# Patient Record
Sex: Female | Born: 1980 | Hispanic: Yes | Marital: Single | Smoking: Former smoker
Health system: Southern US, Community
[De-identification: ages and names within clinical notes are randomized; demographics above are authoritative.]

## PROBLEM LIST (undated history)

## (undated) DIAGNOSIS — J302 Other seasonal allergic rhinitis: Secondary | ICD-10-CM

## (undated) DIAGNOSIS — R87810 Cervical high risk human papillomavirus (HPV) DNA test positive: Secondary | ICD-10-CM

## (undated) DIAGNOSIS — G43009 Migraine without aura, not intractable, without status migrainosus: Secondary | ICD-10-CM

## (undated) DIAGNOSIS — B373 Candidiasis of vulva and vagina: Secondary | ICD-10-CM

## (undated) DIAGNOSIS — T7840XA Allergy, unspecified, initial encounter: Secondary | ICD-10-CM

## (undated) DIAGNOSIS — Z9289 Personal history of other medical treatment: Secondary | ICD-10-CM

## (undated) DIAGNOSIS — R87619 Unspecified abnormal cytological findings in specimens from cervix uteri: Secondary | ICD-10-CM

## (undated) DIAGNOSIS — K219 Gastro-esophageal reflux disease without esophagitis: Secondary | ICD-10-CM

## (undated) DIAGNOSIS — B3731 Acute candidiasis of vulva and vagina: Secondary | ICD-10-CM

## (undated) DIAGNOSIS — A64 Unspecified sexually transmitted disease: Secondary | ICD-10-CM

## (undated) HISTORY — DX: Unspecified sexually transmitted disease: A64

## (undated) HISTORY — DX: Allergy, unspecified, initial encounter: T78.40XA

## (undated) HISTORY — DX: Acute candidiasis of vulva and vagina: B37.31

## (undated) HISTORY — DX: Cervical high risk human papillomavirus (HPV) DNA test positive: R87.810

## (undated) HISTORY — DX: Migraine without aura, not intractable, without status migrainosus: G43.009

## (undated) HISTORY — DX: Candidiasis of vulva and vagina: B37.3

## (undated) HISTORY — DX: Other seasonal allergic rhinitis: J30.2

## (undated) HISTORY — DX: Personal history of other medical treatment: Z92.89

## (undated) HISTORY — PX: COLPOSCOPY: SHX161

## (undated) HISTORY — DX: Gastro-esophageal reflux disease without esophagitis: K21.9

## (undated) HISTORY — DX: Unspecified abnormal cytological findings in specimens from cervix uteri: R87.619

---

## 2006-05-02 DIAGNOSIS — A64 Unspecified sexually transmitted disease: Secondary | ICD-10-CM

## 2006-05-02 HISTORY — DX: Unspecified sexually transmitted disease: A64

## 2011-10-03 ENCOUNTER — Ambulatory Visit: Payer: Self-pay | Admitting: Internal Medicine

## 2013-05-16 DIAGNOSIS — Z9289 Personal history of other medical treatment: Secondary | ICD-10-CM

## 2013-05-16 HISTORY — DX: Personal history of other medical treatment: Z92.89

## 2014-05-02 DIAGNOSIS — R87619 Unspecified abnormal cytological findings in specimens from cervix uteri: Secondary | ICD-10-CM

## 2014-05-02 HISTORY — DX: Unspecified abnormal cytological findings in specimens from cervix uteri: R87.619

## 2015-02-11 ENCOUNTER — Ambulatory Visit (INDEPENDENT_AMBULATORY_CARE_PROVIDER_SITE_OTHER): Payer: BLUE CROSS/BLUE SHIELD | Admitting: Podiatry

## 2015-02-11 ENCOUNTER — Encounter: Payer: Self-pay | Admitting: Podiatry

## 2015-02-11 ENCOUNTER — Ambulatory Visit (INDEPENDENT_AMBULATORY_CARE_PROVIDER_SITE_OTHER): Payer: BLUE CROSS/BLUE SHIELD

## 2015-02-11 VITALS — BP 99/71 | HR 94 | Resp 18

## 2015-02-11 DIAGNOSIS — L600 Ingrowing nail: Secondary | ICD-10-CM

## 2015-02-11 DIAGNOSIS — M2010 Hallux valgus (acquired), unspecified foot: Secondary | ICD-10-CM

## 2015-02-11 DIAGNOSIS — L603 Nail dystrophy: Secondary | ICD-10-CM | POA: Diagnosis not present

## 2015-02-11 DIAGNOSIS — R52 Pain, unspecified: Secondary | ICD-10-CM

## 2015-02-11 NOTE — Progress Notes (Signed)
   Subjective:    Patient ID: Leslie Hendrix, female    DOB: 04/21/1981, 34 y.o.   MRN: [id number]  HPI i have some bunions and multiple ankle sprains on both feet and there is no burning or throbbing and I do get ingrowns often and no laxity without having them removed. My ankles don't bother him enough to consider surgery at this point. However my bunions are becoming painful and are starting to limit my daily activities. She states that she does not even like to work because bunions bother her.    Review of Systems  HENT: Positive for ear pain and sinus pressure.   Respiratory: Positive for cough and wheezing.   Allergic/Immunologic: Positive for environmental allergies.  Hematological: Bruises/bleeds easily.  All other systems reviewed and are negative.      Objective:   Physical Exam: 34 year old female in no apparent distress vital signs stable alert and oriented 3. Pulses are palpable bilateral. Neurologic sensorium is intact per Semmes-Weinstein monofilament. Deep tendon reflexes are intact bilaterally muscle strength +5 over 5 dorsiflexors plantar flexors and inverters everters all of his musculature is intact. Orthopedic evaluation demonstrates all joints distal to the ankle and full range of motion without crepitation. Cutaneous evaluation demonstrates supple. Cutis no erythema edema cellulitis drainage or odor. No pain on range of motion of the ankles or on palpation of the anterior talofibular ligaments. The pain around the posterior tibial tendon or the Achilles tendons. She has very flexible subtalar joints. She has hallux abductovalgus of vomiting bilateral right greater than left with a very prominent hypertrophic medial condyle of the head of the first metatarsal right greater than left. Cutaneous evaluation demonstrates supple well-hydrated cutis no erythema edema cellulitis drainage or odor other than mild erythema along an incurvated nail margin tibial borders hallux bilateral.  Radiographic evaluation does demonstrates an increase in the first intermetatarsal angle greater than 15 bilaterally hallux abductus angle greater than normal value. Hypertrophic medial condyle is present. No signs of osteoarthritic change. Soft tissue margins appear to be normal.        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 Ingrown nails tibial borders hallux right greater than left.  Plan: We discussed the etiology pathology conservative versus surgical therapies. At this point we took samples of the nail sent for pathologic evaluation. We consented her today [facility] with screw fixation right foot. I answered all of questions regarding this procedure to the best of my ability in layman's terms we also consented her for a matrixectomy tibial border hallux right. I answered all questions regarding these procedures the best mobility in layman's terms as well. We did discuss the possible postop complications which may include but are not limited to postop pain bleeding swelling infection recurrence need for further surgery overcorrection under correction loss of digit loss of limb loss of life. We dispensed a cam walker today for follow-up and for postop recovery. I will follow-up with her in the near future for surgical intervention. 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  Arbutus Pedodd Ester Hilley DPM

## 2015-02-11 NOTE — Patient Instructions (Signed)
Pre-Operative Instructions  Congratulations, you have decided to take an important step to improving your quality of life.  You can be assured that the doctors of [facility] will be with you every step of the way.  1. Plan to be at the surgery center/hospital at least 1 (one) hour prior to your scheduled time unless otherwise directed by the surgical center/hospital staff.  You must have a responsible adult accompany you, remain during the surgery and drive you home.  Make sure you have directions to the surgical center/hospital and know how to get there on time. 2. For hospital based surgery you will need to obtain a history and physical form from your family physician within 1 month prior to the date of surgery- we will give you a form for you primary physician.  3. We make every effort to accommodate the date you request for surgery.  There are however, times where surgery dates or times have to be moved.  We will contact you as soon as possible if a change in schedule is required.   4. No Aspirin/Ibuprofen for one week before surgery.  If you are on aspirin, any non-steroidal anti-inflammatory medications (Mobic, Aleve, Ibuprofen) you should stop taking it 7 days prior to your surgery.  You make take Tylenol  For pain prior to surgery.  5. Medications- If you are taking daily heart and blood pressure medications, seizure, reflux, allergy, asthma, anxiety, pain or diabetes medications, make sure the surgery center/hospital is aware before the day of surgery so they may notify you which medications to take or avoid the day of surgery. 6. No food or drink after midnight the night before surgery unless directed otherwise by surgical center/hospital staff. 7. No alcoholic beverages 24 hours prior to surgery.  No smoking 24 hours prior to or 24 hours after surgery. 8. Wear loose pants or shorts- loose enough to fit over bandages, boots, and casts. 9. No slip on shoes, sneakers are best. 10. Bring  your boot with you to the surgery center/hospital.  Also bring crutches or a walker if your physician has prescribed it for you.  If you do not have this equipment, it will be provided for you after surgery. 11. If you have not been contracted by the surgery center/hospital by the day before your surgery, call to confirm the date and time of your surgery. 12. Leave-time from work may vary depending on the type of surgery you have.  Appropriate arrangements should be made prior to surgery with your employer. 13. Prescriptions will be provided immediately following surgery by your doctor.  Have these filled as soon as possible after surgery and take the medication as directed. 14. Remove nail polish on the operative foot. 15. Wash the night before surgery.  The night before surgery wash the foot and leg well with the antibacterial soap provided and water paying special attention to beneath the toenails and in between the toes.  Rinse thoroughly with water and dry well with a towel.  Perform this wash unless told not to do so by your physician.  Enclosed: 1 Ice pack (please put in freezer the night before surgery)   1 Hibiclens skin cleaner   Pre-op Instructions  If you have any questions regarding the instructions, do not hesitate to call our office.  [place]: [address] [place], [place] [address] [phone]  [place]: [address] , [place] [address] [phone]  [place]: [address]  [place], [place] [address] [phone]  Dr. Richard   Tuchman DPM, Dr. Norman Regal DPM Dr. Richard Sikora DPM, Dr. M. Todd Mart Colpitts DPM, Dr. Kathryn Egerton DPM 

## 2015-02-26 ENCOUNTER — Telehealth: Payer: Self-pay | Admitting: *Deleted

## 2015-02-26 NOTE — Telephone Encounter (Signed)
I'm calling on behalf of Dr. Al CorpusHyatt.  He wanted me to call and see if you would like to schedule surgery.  "I have a few questions before I schedule anything.  One is, what is the cost I'd have to pay?  How long would I be out of commission?  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  If you can sit on your job, you can be out for a week as long as you can go to work and elevate.  I'll have to get Vickie in insurance to call you back with an estimate.  "Okay, thank you."

## 2015-03-02 ENCOUNTER — Telehealth: Payer: Self-pay | Admitting: *Deleted

## 2015-03-02 NOTE — Telephone Encounter (Signed)
Called patient to let her know BAKO results are in and appt needed to discuss. Appt made for 03-11-15.

## 2015-03-05 ENCOUNTER — Telehealth: Payer: Self-pay | Admitting: *Deleted

## 2015-03-05 NOTE — Telephone Encounter (Signed)
Pt states she does not have anyone in town to take care of her or drive her after surgery and was wondering about home healthcare.

## 2015-03-11 ENCOUNTER — Ambulatory Visit (INDEPENDENT_AMBULATORY_CARE_PROVIDER_SITE_OTHER): Payer: BLUE CROSS/BLUE SHIELD | Admitting: Podiatry

## 2015-03-11 ENCOUNTER — Encounter: Payer: Self-pay | Admitting: Podiatry

## 2015-03-11 VITALS — BP 101/65 | HR 93 | Resp 16

## 2015-03-11 DIAGNOSIS — L603 Nail dystrophy: Secondary | ICD-10-CM

## 2015-03-11 DIAGNOSIS — Z79899 Other long term (current) drug therapy: Secondary | ICD-10-CM | POA: Diagnosis not present

## 2015-03-11 MED ORDER — TERBINAFINE HCL 250 MG PO TABS: 250.0000 mg | ORAL_TABLET | Freq: Every day | ORAL | Status: DC

## 2015-03-11 NOTE — Patient Instructions (Signed)

## 2015-03-12 ENCOUNTER — Telehealth: Payer: Self-pay | Admitting: *Deleted

## 2015-03-12 LAB — HEPATIC FUNCTION PANEL
ALBUMIN: 4.1 g/dL (ref 3.5–5.5)
ALK PHOS: 92 IU/L (ref 39–117)
ALT: 16 IU/L (ref 0–32)
AST: 16 IU/L (ref 0–40)
BILIRUBIN TOTAL: 0.3 mg/dL (ref 0.0–1.2)
Bilirubin, Direct: 0.09 mg/dL (ref 0.00–0.40)
Total Protein: 7.6 g/dL (ref 6.0–8.5)

## 2015-03-12 LAB — CBC WITH DIFFERENTIAL/PLATELET
BASOS ABS: 0.1 10*3/uL (ref 0.0–0.2)
Basos: 1 %
EOS (ABSOLUTE): 0.1 10*3/uL (ref 0.0–0.4)
Eos: 1 %
Hematocrit: 42.7 % (ref 34.0–46.6)
Hemoglobin: 13.7 g/dL (ref 11.1–15.9)
IMMATURE GRANS (ABS): 0 10*3/uL (ref 0.0–0.1)
Immature Granulocytes: 0 %
LYMPHS: 24 %
Lymphocytes Absolute: 2.4 10*3/uL (ref 0.7–3.1)
MCH: 27.3 pg (ref 26.6–33.0)
MCHC: 32.1 g/dL (ref 31.5–35.7)
MCV: 85 fL (ref 79–97)
MONOS ABS: 0.8 10*3/uL (ref 0.1–0.9)
Monocytes: 9 %
NEUTROS ABS: 6.3 10*3/uL (ref 1.4–7.0)
NEUTROS PCT: 65 %
PLATELETS: 412 10*3/uL — AB (ref 150–379)
RBC: 5.01 x10E6/uL (ref 3.77–5.28)
RDW: 17.1 % — AB (ref 12.3–15.4)
WBC: 9.7 10*3/uL (ref 3.4–10.8)

## 2015-03-12 NOTE — Progress Notes (Signed)
She presents today for follow-up of her lab results regarding her toenails.  Objective: Vital signs are stable she is alert and oriented 3 pulses are palpable. Pathology report demonstrates a nail dystrophy without onychomycosis. However evaluating the toenails on concerned that this very well may be a false negative. Patient would like to try oral therapy.  Assessment nail dystrophy with possible onychomycosis.  Plan: Started her on Lamisil 250 mg tablets one by mouth daily 30 in number. A requisition for liver profiles provided. Should her lab report be abnormal we will notify her immediately. Otherwise I will follow up with her in 1 month.

## 2015-03-12 NOTE — Telephone Encounter (Addendum)
-----   Message from Elinor ParkinsonMax T Hyatt, [place] sent at 03/12/2015  8:06 AM EST ----- Blood work looks good and may continue medication.  Informed pt of orders.

## 2015-04-08 ENCOUNTER — Ambulatory Visit
Admission: RE | Admit: 2015-04-08 | Discharge: 2015-04-08 | Disposition: A | Discharge status: Home / Self Care | Payer: BLUE CROSS/BLUE SHIELD | Source: Ambulatory Visit | Attending: Medical | Admitting: Medical

## 2015-04-08 ENCOUNTER — Other Ambulatory Visit: Payer: Self-pay | Admitting: Family Medicine

## 2015-04-08 ENCOUNTER — Ambulatory Visit (INDEPENDENT_AMBULATORY_CARE_PROVIDER_SITE_OTHER): Payer: BLUE CROSS/BLUE SHIELD | Admitting: Podiatry

## 2015-04-08 ENCOUNTER — Other Ambulatory Visit: Payer: Self-pay | Admitting: Medical

## 2015-04-08 ENCOUNTER — Encounter: Payer: Self-pay | Admitting: Podiatry

## 2015-04-08 ENCOUNTER — Ambulatory Visit (INDEPENDENT_AMBULATORY_CARE_PROVIDER_SITE_OTHER): Payer: BLUE CROSS/BLUE SHIELD

## 2015-04-08 ENCOUNTER — Ambulatory Visit
Admission: RE | Admit: 2015-04-08 | Discharge: 2015-04-08 | Disposition: A | Discharge status: Home / Self Care | Payer: BLUE CROSS/BLUE SHIELD | Source: Ambulatory Visit | Attending: Family Medicine | Admitting: Family Medicine

## 2015-04-08 VITALS — BP 107/62 | HR 81 | Resp 18

## 2015-04-08 DIAGNOSIS — R52 Pain, unspecified: Secondary | ICD-10-CM

## 2015-04-08 DIAGNOSIS — R6889 Other general symptoms and signs: Secondary | ICD-10-CM | POA: Diagnosis present

## 2015-04-08 DIAGNOSIS — Z79899 Other long term (current) drug therapy: Secondary | ICD-10-CM | POA: Diagnosis not present

## 2015-04-08 DIAGNOSIS — S99922A Unspecified injury of left foot, initial encounter: Secondary | ICD-10-CM | POA: Diagnosis not present

## 2015-04-08 DIAGNOSIS — S90122A Contusion of left lesser toe(s) without damage to nail, initial encounter: Secondary | ICD-10-CM | POA: Diagnosis not present

## 2015-04-08 DIAGNOSIS — L603 Nail dystrophy: Secondary | ICD-10-CM | POA: Diagnosis not present

## 2015-04-08 DIAGNOSIS — J3489 Other specified disorders of nose and nasal sinuses: Secondary | ICD-10-CM | POA: Diagnosis not present

## 2015-04-08 MED ORDER — TERBINAFINE HCL 250 MG PO TABS: 250.0000 mg | ORAL_TABLET | Freq: Every day | ORAL | Status: DC

## 2015-04-08 NOTE — Progress Notes (Signed)
She presents today 1 week shy of her normal visit for Lamisil therapy. She denies fever chills nausea vomiting muscle aches pains rashes or breaking out. She also states that she fell after giving blood due to a vasovagal syncope after a hot shower. She states she injured the toes to her left foot.  Objective: Vital signs are stable alert and oriented 3 pulses are strongly palpable left foot. She has mild tenderness on palpation at the DIPJ's in the intermediate phalanges toes #3 and number for the left foot. Radiographs do not demonstrate any type of osseus abnormalities taken the office today.  Assessment: Onychomycosis long-term therapy with Lamisil. Also contusion toes 3 and 4 left foot.  Plan: Discussed etiology pathology conservative versus surgical therapies. Started her on her 90 day course of Lamisil. Also requested blood work. And recommended that the toes to the left foot will go on to heal uneventfully.

## 2015-04-13 ENCOUNTER — Ambulatory Visit: Payer: BLUE CROSS/BLUE SHIELD | Admitting: Podiatry

## 2015-06-26 ENCOUNTER — Telehealth: Payer: Self-pay | Admitting: *Deleted

## 2015-06-26 NOTE — Telephone Encounter (Signed)
"  I'm a patient with Dr. Al Corpus.  I'm calling to schedule my surgery for a bunion surgery.  If you'd give me a call back we can get that set."  I'm calling to let you know I misplaced your consent form.  Dr. Al Corpus would like you to come back in to do another consent form at no charge.  I apologize for any inconvenience.  "Okay, that is fine.  Does he have anything for tomorrow?"  Yes, he can see you at 1:45pm.  "That will be great.  I can get a date after that."

## 2015-07-08 ENCOUNTER — Ambulatory Visit (INDEPENDENT_AMBULATORY_CARE_PROVIDER_SITE_OTHER): Payer: BLUE CROSS/BLUE SHIELD | Admitting: Podiatry

## 2015-07-08 ENCOUNTER — Encounter: Payer: Self-pay | Admitting: Podiatry

## 2015-07-08 DIAGNOSIS — L603 Nail dystrophy: Secondary | ICD-10-CM | POA: Diagnosis not present

## 2015-07-08 DIAGNOSIS — M2010 Hallux valgus (acquired), unspecified foot: Secondary | ICD-10-CM | POA: Diagnosis not present

## 2015-07-08 NOTE — Progress Notes (Signed)
She presents today to re-sign her surgical consent for her right foot. She also states that her toenails seem to be doing much better with the use of the Lamisil.  We went over a consent form today line by line number by number giving her ample time to ask questions she saw fit regarding an [place] bunionectomy repair right foot and a fifth metatarsal osteotomy right foot I answered all of these questions to best of my ability in layman's terms. She understands that there are possible postop complications which may include but are not limited to postop pain bleeding swelling infection recurrence and need for further surgery overcorrection and under correction. I will follow-up with her in the OR in the near future.

## 2015-07-15 ENCOUNTER — Telehealth: Payer: Self-pay | Admitting: *Deleted

## 2015-07-15 NOTE — Telephone Encounter (Signed)
I left patient a message to call me.  I need to see if she wants to schedule surgery.

## 2015-08-03 NOTE — Telephone Encounter (Signed)
"  I'd like to schedule my surgery."  Dr. Al CorpusHyatt does surgeries on Fridays.  His next available date is May 5.  "I'd like to do it on May 12.  Is that fine?"  Yes, that date is available.  "What time do I need to be there?"  Someone from the surgical center will call you a day or two before surgery date.  They will give you the arrival time.  You will need to register with the surgical center, directions are in the brochure to do it online or you can call.

## 2015-08-10 ENCOUNTER — Ambulatory Visit: Payer: BLUE CROSS/BLUE SHIELD | Admitting: Podiatry

## 2015-08-17 ENCOUNTER — Ambulatory Visit: Payer: BLUE CROSS/BLUE SHIELD | Admitting: Podiatry

## 2015-09-01 ENCOUNTER — Telehealth: Payer: Self-pay | Admitting: *Deleted

## 2015-09-01 NOTE — Telephone Encounter (Signed)
ok 

## 2015-09-01 NOTE — Telephone Encounter (Signed)
"  I want to cancel my surgery for May 12."  Would you like to reschedule?  "No, not at this time.  I think I want to consult with him one more time and maybe schedule in the Fall around November.  Do I need to do something to cancel my follow-up appointment?"  No, I will take care of it."  [facility] called and [facility].

## 2015-09-16 ENCOUNTER — Encounter: Payer: Self-pay | Admitting: Podiatry

## 2016-06-09 DIAGNOSIS — J329 Chronic sinusitis, unspecified: Secondary | ICD-10-CM | POA: Insufficient documentation

## 2016-07-07 ENCOUNTER — Encounter: Payer: Self-pay | Admitting: Obstetrics and Gynecology

## 2016-07-07 ENCOUNTER — Ambulatory Visit (INDEPENDENT_AMBULATORY_CARE_PROVIDER_SITE_OTHER): Payer: BLUE CROSS/BLUE SHIELD | Admitting: Obstetrics and Gynecology

## 2016-07-07 VITALS — BP 120/82 | HR 81 | Ht 62.0 in | Wt 182.0 lb

## 2016-07-07 DIAGNOSIS — Z113 Encounter for screening for infections with a predominantly sexual mode of transmission: Secondary | ICD-10-CM | POA: Diagnosis not present

## 2016-07-07 DIAGNOSIS — Z3041 Encounter for surveillance of contraceptive pills: Secondary | ICD-10-CM

## 2016-07-07 DIAGNOSIS — Z124 Encounter for screening for malignant neoplasm of cervix: Secondary | ICD-10-CM | POA: Diagnosis not present

## 2016-07-07 DIAGNOSIS — Z01419 Encounter for gynecological examination (general) (routine) without abnormal findings: Secondary | ICD-10-CM

## 2016-07-07 DIAGNOSIS — N761 Subacute and chronic vaginitis: Secondary | ICD-10-CM | POA: Diagnosis not present

## 2016-07-07 MED ORDER — NORGESTIMATE-ETH ESTRADIOL 0.25-35 MG-MCG PO TABS: 1.0000 | ORAL_TABLET | Freq: Every day | ORAL | 4 refills | Status: DC

## 2016-07-07 NOTE — Progress Notes (Signed)
HPI:      Ms. Leslie Hendrix is a 36 y.o. G1P0010 who LMP was Patient's last menstrual period was 06/06/2016 (exact date)., presents today for her annual examination.  Her menses are 3 months with continuous dosing of OCPs,  lasting 5 day(s).  Dysmenorrhea mild, occurring first 1-2 days of flow. Take NSAIDs with relief. She does not have intermenstrual bleeding. She has a hx of migraines wtihout aura.  She is single partner, contraception - OCP (estrogen/progesterone).  Last Pap: May 16, 2013  Results were: no abnormalities /neg HPV DNA  Hx of STDs: chlamydia/HPV. She would like full STD testing.    There is no FH of breast cancer. There is no FH of ovarian cancer. The patient does not do self-breast exams.  Tobacco use: The patient denies current or previous tobacco use. Alcohol use: social drinker Exercise: moderately active  She does get adequate calcium and Vitamin D in her diet. She has a hx of recurrent vaginitis sx, but cultures are often negative. She has intermittent vag irritation at vag opening, wihtout increased d/c, odor. She uses dove sens skin soap and unscented dryer sheets. She doesn't use lubricants or condoms. No triggers. No sx today.  Past Medical History:  Diagnosis Date  . Abnormal Pap smear of cervix 2016   COLPO C BXS; REPEATS NORMAL  . Candida vaginitis   . Cervical high risk human papillomavirus (HPV) DNA test positive   . History of Papanicolaou smear of cervix 05/16/2013   -/-  . Migraine headache without aura   . Seasonal allergies   . STD (sexually transmitted disease) 2008   CHLAMYDIA    Past Surgical History:  Procedure Laterality Date  . COLPOSCOPY      Family History  Problem Relation Age of Onset  . Cancer Other      ROS:  Review of Systems  Constitutional: Negative for fever, malaise/fatigue and weight loss.  HENT: Negative for congestion, ear pain and sinus pain.   Respiratory: Negative for cough, shortness of breath and  wheezing.   Cardiovascular: Negative for chest pain, orthopnea and leg swelling.  Gastrointestinal: Negative for constipation, diarrhea, nausea and vomiting.  Genitourinary: Negative for dysuria, frequency, hematuria and urgency.       Breast ROS: negative   Musculoskeletal: Negative for back pain, joint pain and myalgias.  Skin: Negative for itching and rash.  Neurological: Positive for headaches. Negative for dizziness, tingling and focal weakness.  Endo/Heme/Allergies: Negative for environmental allergies. Does not bruise/bleed easily.  Psychiatric/Behavioral: Negative for depression and suicidal ideas. The patient is not nervous/anxious and does not have insomnia.     Objective: BP 120/82 (Patient Position: Sitting)   Pulse 81   Ht 5\' 2"  (1.575 m)   Wt 182 lb (82.6 kg)   LMP 06/06/2016 (Exact Date)   BMI 33.29 kg/m    Physical Exam  Constitutional: She is oriented to person, place, and time. She appears well-developed and well-nourished.  Genitourinary: Vagina normal and uterus normal. No erythema or tenderness in the vagina. No vaginal discharge found. Right adnexum does not display mass and does not display tenderness. Left adnexum does not display mass and does not display tenderness. Cervix does not exhibit motion tenderness or polyp. Uterus is not enlarged or tender.  Neck: Normal range of motion. No thyromegaly present.  Cardiovascular: Normal rate, regular rhythm and normal heart sounds.   No murmur heard. Pulmonary/Chest: Effort normal and breath sounds normal. Right breast exhibits no mass, no nipple  discharge, no skin change and no tenderness. Left breast exhibits no mass, no nipple discharge, no skin change and no tenderness.  Abdominal: Soft. There is no tenderness. There is no guarding.  Musculoskeletal: Normal range of motion.  Neurological: She is alert and oriented to person, place, and time. No cranial nerve deficit.  Psychiatric: She has a normal mood and  affect. Her behavior is normal.  Vitals reviewed.   Assessment/Plan: Encounter for annual routine gynecological examination  Cervical cancer screening - Plan: IGP,CtNg,AptimaHPV  Screening for STD (sexually transmitted disease) - Plan: IGP,CtNg,AptimaHPV, HIV antibody, RPR Qual, HSV 2 antibody, IgG, Hepatitis C antibody  Encounter for surveillance of contraceptive pills  Subacute vaginitis - D/C all dryer sheets. Question chem irritation since neg eval in past. Try OTC hydrocortisone crm prn. F/u prn.  Meds ordered this encounter  Medications  . norgestimate-ethinyl estradiol (ORTHO-CYCLEN,SPRINTEC,PREVIFEM) 0.25-35 MG-MCG tablet    Sig: Take 1 tablet by mouth daily. CONTINUOUS DOSING    Dispense:  3 Package    Refill:  4                 GYN counsel use and side effects of OCP's, adequate intake of calcium and vitamin D     F/U  Return in about 1 year (around 07/07/2017).  Eliya Bubar B. Eris Hannan, PA-C 07/07/2016 12:01 PM

## 2016-07-08 LAB — HIV ANTIBODY (ROUTINE TESTING W REFLEX): HIV Screen 4th Generation wRfx: NONREACTIVE

## 2016-07-08 LAB — HEPATITIS C ANTIBODY

## 2016-07-08 LAB — RPR QUALITATIVE: RPR Ser Ql: NONREACTIVE

## 2016-07-08 LAB — HSV 2 ANTIBODY, IGG

## 2016-07-12 LAB — IGP,CTNG,APTIMAHPV
CHLAMYDIA, NUC. ACID AMP: NEGATIVE
GONOCOCCUS BY NUCLEIC ACID AMP: NEGATIVE
HPV Aptima: NEGATIVE
PAP Smear Comment: 0

## 2016-08-22 ENCOUNTER — Encounter: Payer: Self-pay | Admitting: Obstetrics and Gynecology

## 2016-08-23 ENCOUNTER — Other Ambulatory Visit: Payer: Self-pay | Admitting: Obstetrics and Gynecology

## 2016-08-23 MED ORDER — NORGESTIMATE-ETH ESTRADIOL 0.25-35 MG-MCG PO TABS: 1.0000 | ORAL_TABLET | Freq: Every day | ORAL | 0 refills | Status: DC

## 2016-08-28 ENCOUNTER — Other Ambulatory Visit: Payer: Self-pay | Admitting: Obstetrics and Gynecology

## 2016-08-28 MED ORDER — NORGESTIMATE-ETH ESTRADIOL 0.25-35 MG-MCG PO TABS: 1.0000 | ORAL_TABLET | Freq: Every day | ORAL | 4 refills | Status: DC

## 2016-09-15 ENCOUNTER — Ambulatory Visit: Payer: BLUE CROSS/BLUE SHIELD | Admitting: Medical

## 2016-09-19 ENCOUNTER — Ambulatory Visit: Payer: BLUE CROSS/BLUE SHIELD | Admitting: Medical

## 2016-09-19 ENCOUNTER — Encounter: Payer: Self-pay | Admitting: Medical

## 2016-09-19 VITALS — BP 130/80 | HR 80 | Temp 98.1°F | Resp 16 | Ht 62.0 in | Wt 185.0 lb

## 2016-09-19 DIAGNOSIS — J0110 Acute frontal sinusitis, unspecified: Secondary | ICD-10-CM

## 2016-09-19 DIAGNOSIS — B3731 Acute candidiasis of vulva and vagina: Secondary | ICD-10-CM

## 2016-09-19 DIAGNOSIS — B373 Candidiasis of vulva and vagina: Secondary | ICD-10-CM

## 2016-09-19 DIAGNOSIS — M25542 Pain in joints of left hand: Principal | ICD-10-CM

## 2016-09-19 DIAGNOSIS — Z0000 Encounter for general adult medical examination without abnormal findings: Secondary | ICD-10-CM

## 2016-09-19 DIAGNOSIS — M25541 Pain in joints of right hand: Secondary | ICD-10-CM

## 2016-09-19 MED ORDER — AMOXICILLIN 875 MG PO TABS: 875.0000 mg | ORAL_TABLET | Freq: Two times a day (BID) | ORAL | 0 refills | Status: AC

## 2016-09-19 MED ORDER — FLUCONAZOLE 150 MG PO TABS: 150.0000 mg | ORAL_TABLET | Freq: Once | ORAL | 0 refills | Status: DC

## 2016-09-19 NOTE — Progress Notes (Signed)
   Subjective:    Patient ID: Leslie Hendrix, female    DOB: 07/16/1980, 36 y.o.   MRN: [id number]  HPI  36 yo female here for primary care appointment.  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 She has been siick for 2 weeks, did not have time to come into clinic. Nasal congestion, with green discharge. No cough, no sore throat. Otherwise has no other complaints.   Review of Systems  Constitutional: Positive for appetite change and fatigue.  HENT: Positive for congestion, ear pain, postnasal drip, rhinorrhea, sinus pressure, sore throat and voice change. Negative for sinus pain.   Eyes: Negative.   Respiratory: Negative.   Cardiovascular: Negative.   Gastrointestinal: Negative.   Endocrine: Negative.   Genitourinary: Negative.   Musculoskeletal: Positive for arthralgias. Negative for joint swelling.  Skin: Negative.   Allergic/Immunologic: Positive for environmental allergies. Negative for food allergies.  Neurological: Negative.   Hematological: Bruises/bleeds easily.  Psychiatric/Behavioral: Negative.    Joint pain and swelling in hands, has this all the time. Bruises easily.   more hungry , stressed with moving. Objective:   Physical Exam  Constitutional: She is oriented to person, place, and time. She appears well-developed and well-nourished.  HENT:  Head: Normocephalic and atraumatic.  Right Ear: External ear normal. A middle ear effusion is present.  Left Ear: External ear normal. A middle ear effusion is present.  Nose: Mucosal edema and rhinorrhea present.  Mouth/Throat: Uvula is midline, oropharynx is clear and moist and mucous membranes are normal. Uvula swelling present.  Eyes: Conjunctivae and EOM are normal. Pupils are equal, round, and reactive to light. No scleral icterus.  Neck: Normal range of motion. Neck supple. No thyromegaly present.  Cardiovascular: Normal rate, regular rhythm and intact distal pulses.  Exam reveals no gallop and no friction  rub.   No murmur heard. Pulmonary/Chest: Effort normal and breath sounds normal.  Abdominal: Soft. Bowel sounds are normal. She exhibits no mass. There is no tenderness.  Musculoskeletal: Normal range of motion.  Lymphadenopathy:    She has no cervical adenopathy.  Neurological: She is alert and oriented to person, place, and time.  Skin: Skin is warm and dry.  Psychiatric: She has a normal mood and affect. Her behavior is normal. Judgment and thought content normal.  Nursing note and vitals reviewed.   Green discharge in nostrils bilaterallly. Patient to tense      Assessment & Plan:  Primary care exam Sinusitis e-prescribed Amoxil 875 mg one by mouth twice daily for  10 days with food. #20 no refills. Return to clinic in 3-5 days if not improving. Schedule fasting labs Exec panel female. Return to the clinic in 3-5 days if not improving.

## 2016-09-21 ENCOUNTER — Other Ambulatory Visit: Payer: BLUE CROSS/BLUE SHIELD

## 2016-09-21 DIAGNOSIS — Z0000 Encounter for general adult medical examination without abnormal findings: Secondary | ICD-10-CM

## 2016-09-21 LAB — POCT URINALYSIS DIPSTICK
BILIRUBIN UA: NEGATIVE
Blood, UA: NEGATIVE
GLUCOSE UA: NEGATIVE
Ketones, UA: NEGATIVE
LEUKOCYTES UA: NEGATIVE
NITRITE UA: NEGATIVE
Protein, UA: NEGATIVE
Spec Grav, UA: 1.01 (ref 1.010–1.025)
Urobilinogen, UA: 0.2 E.U./dL
pH, UA: 6 (ref 5.0–8.0)

## 2016-09-22 LAB — CMP12+LP+TP+TSH+6AC+CBC/D/PLT
ALK PHOS: 70 IU/L (ref 39–117)
ALT: 16 IU/L (ref 0–32)
AST: 17 IU/L (ref 0–40)
Albumin/Globulin Ratio: 1.3 (ref 1.2–2.2)
Albumin: 4 g/dL (ref 3.5–5.5)
BASOS: 1 %
BILIRUBIN TOTAL: 0.3 mg/dL (ref 0.0–1.2)
BUN / CREAT RATIO: 21 (ref 9–23)
BUN: 15 mg/dL (ref 6–20)
Basophils Absolute: 0.1 10*3/uL (ref 0.0–0.2)
CALCIUM: 9.1 mg/dL (ref 8.7–10.2)
CHLORIDE: 97 mmol/L (ref 96–106)
CREATININE: 0.73 mg/dL (ref 0.57–1.00)
Chol/HDL Ratio: 2.5 ratio (ref 0.0–4.4)
Cholesterol, Total: 164 mg/dL (ref 100–199)
EOS (ABSOLUTE): 0.1 10*3/uL (ref 0.0–0.4)
EOS: 1 %
Free Thyroxine Index: 1.7 (ref 1.2–4.9)
GFR calc Af Amer: 124 mL/min/{1.73_m2} (ref >59)
GFR, EST NON AFRICAN AMERICAN: 108 mL/min/{1.73_m2} (ref >59)
GGT: 12 IU/L (ref 0–60)
GLUCOSE: 84 mg/dL (ref 65–99)
Globulin, Total: 3 g/dL (ref 1.5–4.5)
HDL: 66 mg/dL (ref >39)
HEMATOCRIT: 41.8 % (ref 34.0–46.6)
HEMOGLOBIN: 13.5 g/dL (ref 11.1–15.9)
IRON: 92 ug/dL (ref 27–159)
Immature Grans (Abs): 0 10*3/uL (ref 0.0–0.1)
Immature Granulocytes: 0 %
LDH: 114 IU/L — AB (ref 119–226)
LDL CALC: 69 mg/dL (ref 0–99)
LYMPHS ABS: 2.8 10*3/uL (ref 0.7–3.1)
Lymphs: 28 %
MCH: 28.7 pg (ref 26.6–33.0)
MCHC: 32.3 g/dL (ref 31.5–35.7)
MCV: 89 fL (ref 79–97)
Monocytes Absolute: 0.8 10*3/uL (ref 0.1–0.9)
Monocytes: 8 %
NEUTROS ABS: 6.1 10*3/uL (ref 1.4–7.0)
Neutrophils: 62 %
Phosphorus: 4.1 mg/dL (ref 2.5–4.5)
Platelets: 398 10*3/uL — ABNORMAL HIGH (ref 150–379)
Potassium: 4.2 mmol/L (ref 3.5–5.2)
RBC: 4.71 x10E6/uL (ref 3.77–5.28)
RDW: 14.2 % (ref 12.3–15.4)
SODIUM: 135 mmol/L (ref 134–144)
T3 UPTAKE RATIO: 20 % — AB (ref 24–39)
T4, Total: 8.6 ug/dL (ref 4.5–12.0)
TSH: 1.79 u[IU]/mL (ref 0.450–4.500)
Total Protein: 7 g/dL (ref 6.0–8.5)
Triglycerides: 144 mg/dL (ref 0–149)
Uric Acid: 4.6 mg/dL (ref 2.5–7.1)
VLDL CHOLESTEROL CAL: 29 mg/dL (ref 5–40)
WBC: 9.9 10*3/uL (ref 3.4–10.8)

## 2016-09-22 LAB — HGB A1C W/O EAG: HEMOGLOBIN A1C: 5.6 % (ref 4.8–5.6)

## 2016-09-22 LAB — VITAMIN D 25 HYDROXY (VIT D DEFICIENCY, FRACTURES): VIT D 25 HYDROXY: 43.1 ng/mL (ref 30.0–100.0)

## 2016-09-29 ENCOUNTER — Other Ambulatory Visit: Payer: Self-pay | Admitting: Medical

## 2016-09-29 DIAGNOSIS — B373 Candidiasis of vulva and vagina: Secondary | ICD-10-CM

## 2016-09-29 DIAGNOSIS — B3731 Acute candidiasis of vulva and vagina: Secondary | ICD-10-CM

## 2016-10-16 ENCOUNTER — Encounter: Payer: Self-pay | Admitting: Medical

## 2016-12-20 ENCOUNTER — Telehealth: Payer: Self-pay | Admitting: Obstetrics and Gynecology

## 2016-12-20 ENCOUNTER — Encounter: Payer: Self-pay | Admitting: Obstetrics and Gynecology

## 2016-12-21 ENCOUNTER — Other Ambulatory Visit: Payer: Self-pay | Admitting: Obstetrics and Gynecology

## 2016-12-21 MED ORDER — NORGESTIMATE-ETH ESTRADIOL 0.25-35 MG-MCG PO TABS: 1.0000 | ORAL_TABLET | Freq: Every day | ORAL | 1 refills | Status: AC

## 2016-12-22 ENCOUNTER — Other Ambulatory Visit: Payer: Self-pay | Admitting: Obstetrics and Gynecology

## 2016-12-22 MED ORDER — MONONESSA 0.25-35 MG-MCG PO TABS: 1.0000 | ORAL_TABLET | Freq: Every day | ORAL | 1 refills | Status: AC

## 2017-08-05 IMAGING — CR DG NASAL BONES 3+V
1 series · 3 of 3 positions shown · non-contrast
Comparison: None.

CLINICAL DATA: Patient fell out of shower yesterday after giving
blood and landed on face

EXAM:
NASAL BONES - 3+ VIEW

[Series 1: dg nasal bones · 0.14mm/px · 3 of 3 slices shown]
[im 1/3]
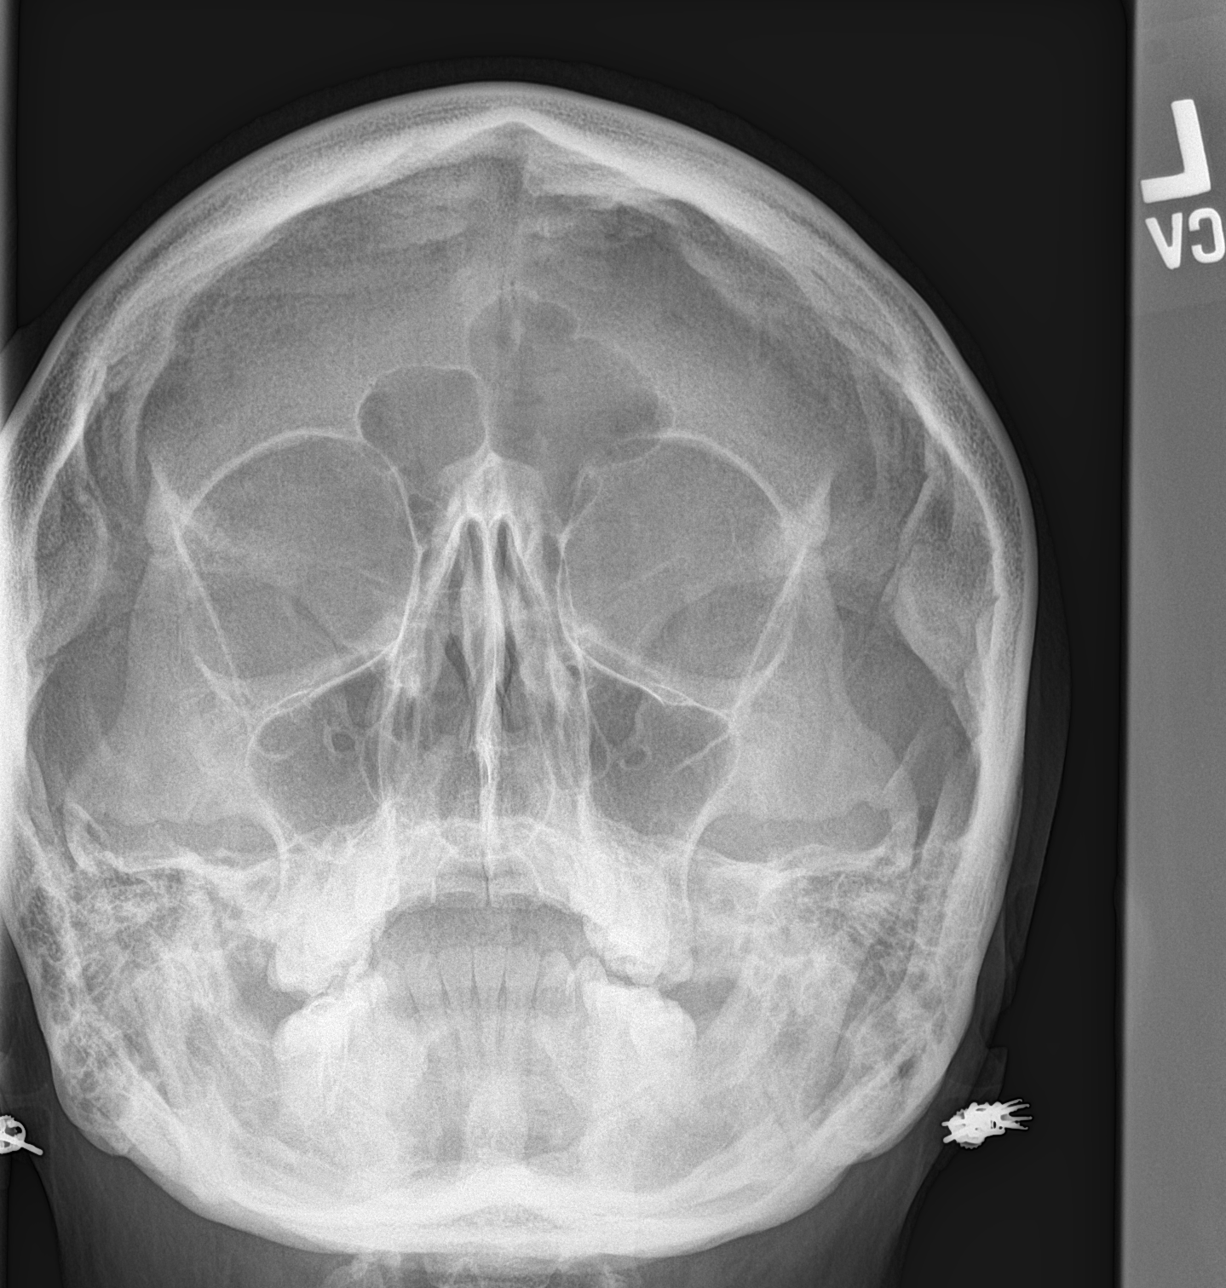
[im 2/3]
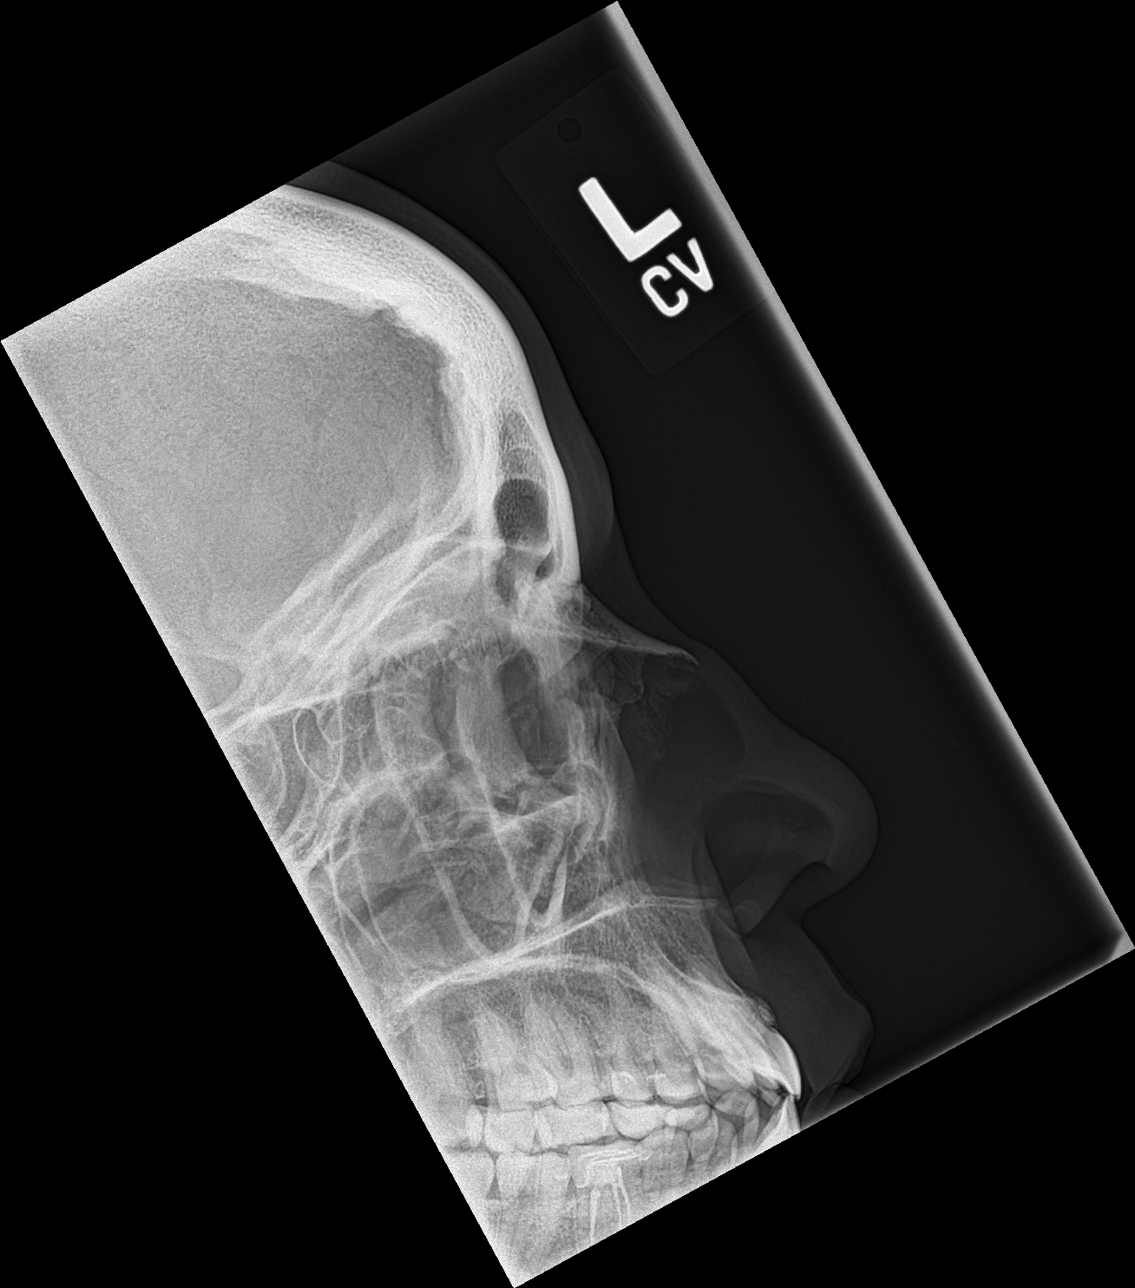
[im 3/3]
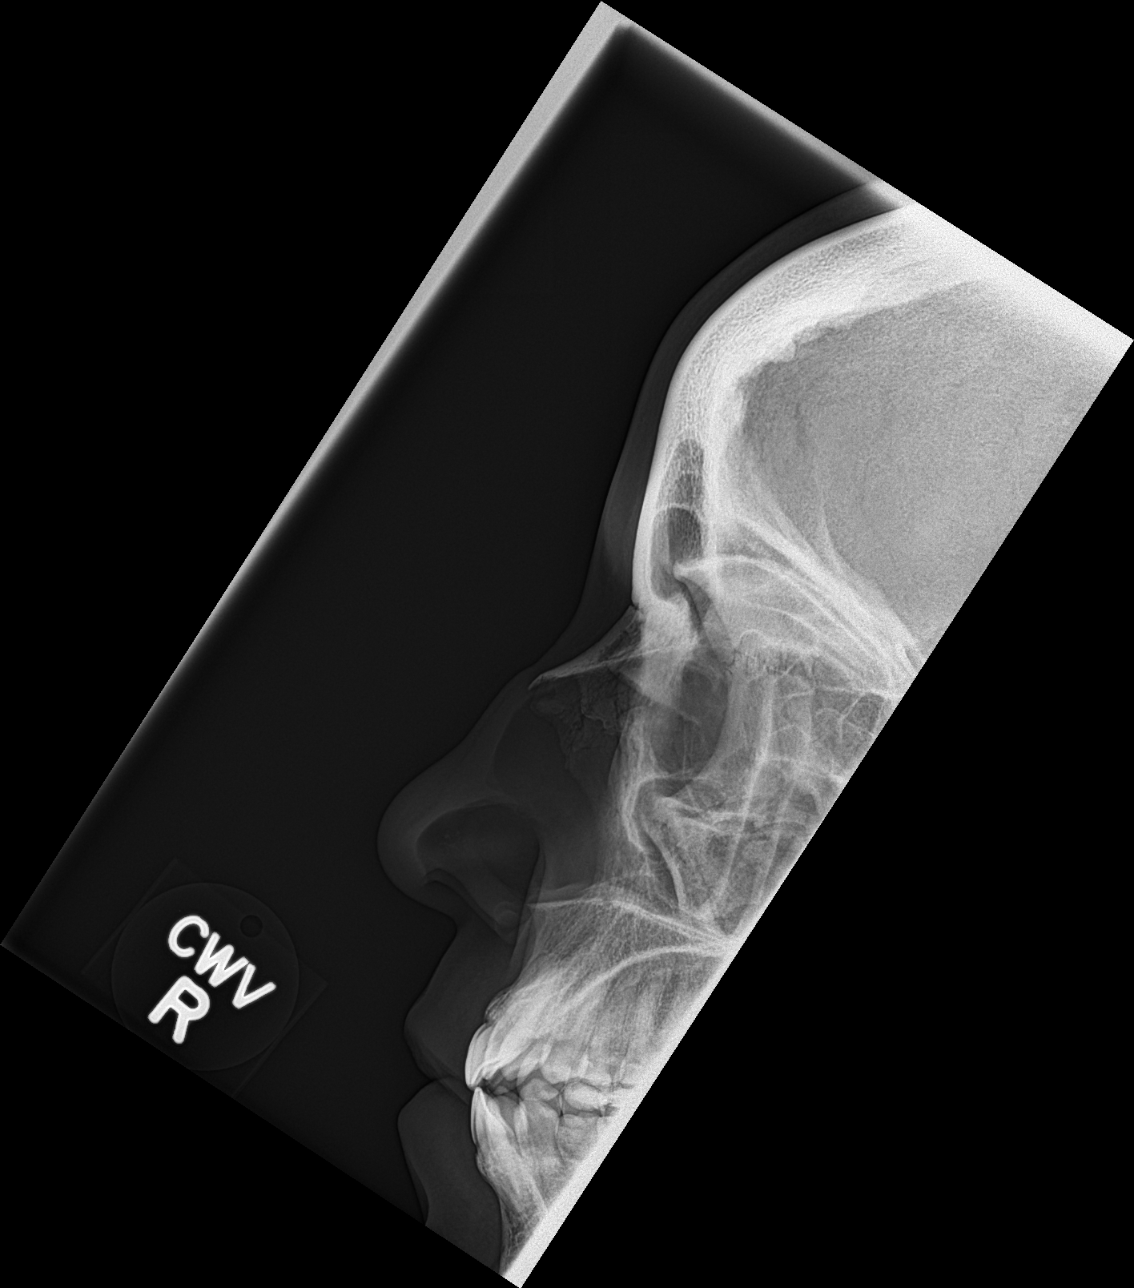

[3 of 3 positions shown; findings below may reference images not displayed]

FINDINGS: There appears to be a nondisplaced fracture at the base of the nasal
bone where it articulates with the frontal bone. The paranasal
sinuses are clear. No evidence for orbital blowout fracture.
IMPRESSION: 1. Suspect base of nasal bone fracture.

## 2020-09-10 NOTE — Telephone Encounter (Signed)
error
